# Patient Record
Sex: Male | Born: 1975 | Race: Asian | Hispanic: No | Marital: Single | Smoking: Never smoker
Health system: Southern US, Community
[De-identification: ages and names within clinical notes are randomized; demographics above are authoritative.]

## PROBLEM LIST (undated history)

## (undated) HISTORY — PX: TONSILLECTOMY: SUR1361

---

## 2000-02-10 ENCOUNTER — Other Ambulatory Visit: Admission: RE | Admit: 2000-02-10 | Discharge: 2000-02-10 | Payer: Self-pay | Admitting: Otolaryngology

## 2005-04-07 ENCOUNTER — Observation Stay (HOSPITAL_COMMUNITY): Admission: EM | Admit: 2005-04-07 | Discharge: 2005-04-08 | Payer: Self-pay | Admitting: Emergency Medicine

## 2011-03-07 ENCOUNTER — Other Ambulatory Visit: Payer: Self-pay | Admitting: Podiatry

## 2011-03-07 DIAGNOSIS — M779 Enthesopathy, unspecified: Secondary | ICD-10-CM

## 2011-03-11 ENCOUNTER — Other Ambulatory Visit: Payer: Self-pay | Admitting: Podiatry

## 2011-03-11 ENCOUNTER — Other Ambulatory Visit: Payer: Self-pay

## 2011-03-11 ENCOUNTER — Ambulatory Visit
Admission: RE | Admit: 2011-03-11 | Discharge: 2011-03-11 | Disposition: A | Discharge status: Home / Self Care | Payer: BC Managed Care – PPO | Source: Ambulatory Visit | Attending: Podiatry | Admitting: Podiatry

## 2011-03-11 DIAGNOSIS — IMO0001 Reserved for inherently not codable concepts without codable children: Secondary | ICD-10-CM

## 2011-03-11 DIAGNOSIS — M779 Enthesopathy, unspecified: Secondary | ICD-10-CM

## 2011-03-11 MED ORDER — GADOBENATE DIMEGLUMINE 529 MG/ML IV SOLN
10.0000 mL | Freq: Once | INTRAVENOUS | Status: AC | PRN
  Administered 2011-03-11: 10 mL via INTRAVENOUS

## 2017-04-19 DIAGNOSIS — G4733 Obstructive sleep apnea (adult) (pediatric): Secondary | ICD-10-CM | POA: Diagnosis not present

## 2017-05-19 DIAGNOSIS — G4733 Obstructive sleep apnea (adult) (pediatric): Secondary | ICD-10-CM | POA: Diagnosis not present

## 2017-06-19 DIAGNOSIS — G4733 Obstructive sleep apnea (adult) (pediatric): Secondary | ICD-10-CM | POA: Diagnosis not present

## 2017-06-20 ENCOUNTER — Emergency Department (HOSPITAL_BASED_OUTPATIENT_CLINIC_OR_DEPARTMENT_OTHER)
Admission: EM | Admit: 2017-06-20 | Discharge: 2017-06-20 | Disposition: A | Discharge status: Home / Self Care | Payer: BLUE CROSS/BLUE SHIELD | Attending: Physician Assistant | Admitting: Physician Assistant

## 2017-06-20 ENCOUNTER — Encounter (HOSPITAL_BASED_OUTPATIENT_CLINIC_OR_DEPARTMENT_OTHER): Payer: Self-pay | Admitting: Emergency Medicine

## 2017-06-20 ENCOUNTER — Emergency Department (HOSPITAL_BASED_OUTPATIENT_CLINIC_OR_DEPARTMENT_OTHER): Payer: BLUE CROSS/BLUE SHIELD

## 2017-06-20 DIAGNOSIS — M1712 Unilateral primary osteoarthritis, left knee: Secondary | ICD-10-CM | POA: Diagnosis not present

## 2017-06-20 DIAGNOSIS — M25562 Pain in left knee: Secondary | ICD-10-CM | POA: Insufficient documentation

## 2017-06-20 NOTE — Discharge Instructions (Signed)
Please rest ice and elevate and use ibuprofen to treat your pain.

## 2017-06-20 NOTE — ED Provider Notes (Signed)
[facility]   CSN: [id number] Arrival date & time: 06/20/17  2018     History   Chief Complaint Chief Complaint  Patient presents with  . Knee Pain    HPI VA BROADWELL is a 41 y.o. male.  HPI   Patient is a 41 year old male presenting with left knee pain worse in the morning. Patient has x-ray showing small joint effusion. No erythema no warmth. No signs of infection. Patient reports he uses a lot of work. Worse with movement.  History reviewed. No pertinent past medical history.  There are no active problems to display for this patient.   History reviewed. No pertinent surgical history.     Home Medications    Prior to Admission medications   Not on File    Family History No family history on file.  Social History Social History  Substance Use Topics  . Smoking status: Never Smoker  . Smokeless tobacco: Never Used  . Alcohol use Yes     Comment: occ     Allergies   Patient has no known allergies.   Review of Systems Review of Systems  Constitutional: Negative for activity change.  Respiratory: Negative for shortness of breath.   Cardiovascular: Negative for chest pain.  Gastrointestinal: Negative for abdominal pain.  Musculoskeletal: Positive for joint swelling.     Physical Exam Updated Vital Signs BP 134/88 (BP Location: Left Arm)   Pulse 89   Temp 98.3 F (36.8 C) (Oral)   Resp 18   Ht 5\' 9"  (1.753 m)   Wt 95.3 kg (210 lb)   SpO2 100%   BMI 31.01 kg/m   Physical Exam  Constitutional: He is oriented to person, place, and time. He appears well-nourished.  HENT:  Head: Normocephalic.  Eyes: Conjunctivae are normal.  Cardiovascular: Normal rate.   Pulmonary/Chest: Effort normal.  Musculoskeletal:  Nor erythema no warmth. Mild tenderness with range, no obvious swelling.  Neurological: He is oriented to person, place, and time.  Skin: Skin is warm and dry. He is not diaphoretic.  Psychiatric: He has a  normal mood and affect. His behavior is normal.     ED Treatments / Results  Labs (all labs ordered are listed, but only abnormal results are displayed) Labs Reviewed - No data to display  EKG  EKG Interpretation None       Radiology Dg Knee Complete 4 Views Left  Result Date: 06/20/2017 CLINICAL DATA:  Lateral knee pain x1 week without known injury. EXAM: LEFT KNEE - COMPLETE 4+ VIEW COMPARISON:  None. FINDINGS: Femorotibial and patellofemoral joint space narrowing with spurring is identified consistent with osteoarthritis. Trace suprapatellar joint effusion is noted. No acute fracture, intra-articular loose body nor bone destruction. No joint dislocation. Soft tissues are unremarkable. 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 Electronically Signed   By: Tollie Eth M.D.   On: 06/20/2017 21:09    Procedures Procedures (including critical care time)  Medications Ordered in ED Medications - No data to display   Initial Impression / Assessment and Plan / ED Course  I have reviewed the triage vital signs and the nursing notes.  Pertinent labs & imaging results that were available during my care of the patient were reviewed by me and considered in my medical decision making (see chart for details).     Well-appearing 41 year old presenting with left knee pain. X-ray shows arthritis with effusion. Doubt septic joint no erythema no warmth. We'll have him rest ice  elevate and use knee sleeve.  Final Clinical Impressions(s) / ED Diagnoses   Final diagnoses:  None    New Prescriptions New Prescriptions   No medications on file     Abelino DerrickMackuen, Pearle Wandler Lyn, MD 06/20/17 2237

## 2017-06-20 NOTE — ED Triage Notes (Signed)
aptient states that he has had pain to his left knee x 2 days. He reports it is worse in the morning

## 2017-07-02 DIAGNOSIS — G4733 Obstructive sleep apnea (adult) (pediatric): Secondary | ICD-10-CM | POA: Diagnosis not present

## 2018-01-04 DIAGNOSIS — G4733 Obstructive sleep apnea (adult) (pediatric): Secondary | ICD-10-CM | POA: Diagnosis not present

## 2018-07-07 DIAGNOSIS — G4733 Obstructive sleep apnea (adult) (pediatric): Secondary | ICD-10-CM | POA: Diagnosis not present

## 2019-01-20 DIAGNOSIS — G4733 Obstructive sleep apnea (adult) (pediatric): Secondary | ICD-10-CM | POA: Diagnosis not present

## 2019-03-27 ENCOUNTER — Encounter (HOSPITAL_BASED_OUTPATIENT_CLINIC_OR_DEPARTMENT_OTHER): Payer: Self-pay | Admitting: Emergency Medicine

## 2019-03-27 ENCOUNTER — Other Ambulatory Visit: Payer: Self-pay

## 2019-03-27 ENCOUNTER — Emergency Department (HOSPITAL_BASED_OUTPATIENT_CLINIC_OR_DEPARTMENT_OTHER): Payer: BLUE CROSS/BLUE SHIELD

## 2019-03-27 ENCOUNTER — Emergency Department (HOSPITAL_BASED_OUTPATIENT_CLINIC_OR_DEPARTMENT_OTHER)
Admission: EM | Admit: 2019-03-27 | Discharge: 2019-03-27 | Disposition: A | Discharge status: Home / Self Care | Payer: BLUE CROSS/BLUE SHIELD | Attending: Emergency Medicine | Admitting: Emergency Medicine

## 2019-03-27 DIAGNOSIS — M25571 Pain in right ankle and joints of right foot: Secondary | ICD-10-CM | POA: Insufficient documentation

## 2019-03-27 NOTE — ED Triage Notes (Signed)
R ankle pain since Tuesday. Denies injury.

## 2019-03-27 NOTE — ED Provider Notes (Signed)
[facility] EMERGENCY DEPARTMENT Provider Note   CSN: [id number] Arrival date & time: 03/27/19  1202    History   Chief Complaint Chief Complaint  Patient presents with   Ankle Pain    HPI Brady Vazquez is a 43 y.o. male.     HPI   Brady Vazquez is a 43 y.o. male, patient with no pertinent past medical history, presenting to the ED with right ankle pain for the last 5 days.  States he woke up in the morning with pain to the anterior ankle with mild swelling.  Pain is moderate, described as a tightness and soreness, now involving the lateral and medial malleoli. He is on his feet daily wearing steel toed boots as part of his job, which involves a lot of walking. He has tried ice, elevation, oral anti-inflammatories, and diclofenac gel.  These helped temporarily, but then pain returns. Denies fever/chills, numbness, weakness, falls/trauma, leg pain/swelling, color change, or any other complaints.   History reviewed. No pertinent past medical history.  There are no active problems to display for this patient.   Past Surgical History:  Procedure Laterality Date   TONSILLECTOMY          Home Medications    Prior to Admission medications   Not on File    Family History No family history on file.  Social History Social History   Tobacco Use   Smoking status: Never Smoker   Smokeless tobacco: Never Used  Substance Use Topics   Alcohol use: Yes    Comment: occ   Drug use: No     Allergies   Patient has no known allergies.   Review of Systems Review of Systems  Constitutional: Negative for fever.  Musculoskeletal: Positive for arthralgias and joint swelling.  Neurological: Negative for weakness and numbness.     Physical Exam Updated Vital Signs BP (!) 150/101 (BP Location: Left Arm)    Pulse 73    Temp 98.4 F (36.9 C) (Oral)    Resp 18    Ht 5\' 11"  (1.803 m)    Wt 96.6 kg    SpO2 98%    BMI 29.71 kg/m   Physical Exam Vitals signs  and nursing note reviewed.  Constitutional:      General: He is not in acute distress.    Appearance: He is well-developed. He is not diaphoretic.  HENT:     Head: Normocephalic and atraumatic.  Eyes:     Conjunctiva/sclera: Conjunctivae normal.  Neck:     Musculoskeletal: Neck supple.  Cardiovascular:     Rate and Rhythm: Normal rate and regular rhythm.     Pulses:          Dorsalis pedis pulses are 2+ on the right side and 2+ on the left side.       Posterior tibial pulses are 2+ on the right side and 2+ on the left side.  Pulmonary:     Effort: Pulmonary effort is normal.  Musculoskeletal:     Right ankle: Tenderness. Lateral malleolus and medial malleolus tenderness found.       Feet:     Comments: Tenderness to the anterior right ankle as well as the lateral and medial malleoli.  Mild swelling.  No erythema or increased warmth.  No bruising. No tenderness over the Achilles or into the anterior or posterior lower leg. Dorsiflexion without difficulty or pain.  Plantar flexion with stiffness and pain. Flexion and extension in the toes intact.  Skin:    General: Skin is warm and dry.     Capillary Refill: Capillary refill takes less than 2 seconds.     Coloration: Skin is not pale.  Neurological:     Mental Status: He is alert.     Comments: Sensation to light touch grossly intact in the right foot. Dorsiflexion 5/5, plantar flexion 4/5, patient states this is due to pain. Strength 5/5 with flexion and extension at the right knee. Strength 5/5 with flexion and extension at the right great toe.   Psychiatric:        Behavior: Behavior normal.      ED Treatments / Results  Labs (all labs ordered are listed, but only abnormal results are displayed) Labs Reviewed - No data to display  EKG None  Radiology Dg Ankle Complete Right  Result Date: 03/27/2019 CLINICAL DATA:  Right ankle pain for the past 5 days. No known injury. EXAM: RIGHT ANKLE - COMPLETE 3+ VIEW  COMPARISON:  None. FINDINGS: No fracture or dislocation. Minimal enthesopathic change about the distal tips of the medial and lateral malleoli. Joint spaces appear preserved. The ankle mortise appears preserved given obliquity. No definite ankle joint effusion. Moderate-sized plantar calcaneal spur. Enthesopathic change involving the Achilles tendon insertion site. Regional soft tissues appear normal. IMPRESSION: 1. No acute findings. 2. Enthesopathic change about the medial and lateral malleoli, likely the sequela of remote avulsive injury Electronically Signed   By: Simonne ComeJohn  Watts M.D.   On: 03/27/2019 13:14    Procedures Procedures (including critical care time)  Medications Ordered in ED Medications - No data to display   Initial Impression / Assessment and Plan / ED Course  I have reviewed the triage vital signs and the nursing notes.  Pertinent labs & imaging results that were available during my care of the patient were reviewed by me and considered in my medical decision making (see chart for details).        Patient presents with right ankle pain.  No evidence of neurovascular compromise. Septic joint considered, but thought less likely due to timeline, lack of risk factors, and exam is not suggestive. Weightbearing as tolerated.  Declined crutches. Ankle x-ray appears to have sequela of previous injury.  No acute abnormalities noted. The patient was given instructions for home care as well as return precautions. Patient voices understanding of these instructions, accepts the plan, and is comfortable with discharge.  Discussed his hypertension and need for PCP follow-up.  Asymptomatic at this time.  Final Clinical Impressions(s) / ED Diagnoses   Final diagnoses:  Acute right ankle pain    ED Discharge Orders    None       Concepcion LivingJoy, Jayveon Convey C, PA-C 03/27/19 1337    Virgina Norfolkuratolo, Adam, DO 03/27/19 1349

## 2019-03-27 NOTE — ED Notes (Signed)
ED Provider at bedside. 

## 2019-03-27 NOTE — Discharge Instructions (Addendum)
°  Rest the extremity as often as possible.  Combined this with elevation. Antiinflammatory medications: Take 600 mg of ibuprofen every 6 hours or 440 mg (over the counter dose) to 500 mg (prescription dose) of naproxen every 12 hours for the next 3 days. After this time, these medications may be used as needed for pain. Take these medications with food to avoid upset stomach. Choose only one of these medications, do not take them together. Acetaminophen (generic for Tylenol): Should you continue to have additional pain while taking the ibuprofen or naproxen, you may add in acetaminophen as needed. Your daily total maximum amount of acetaminophen from all sources should be limited to 4000mg /day for persons without liver problems, or 2000mg /day for those with liver problems.  Wear the ankle brace for support and comfort.  Weightbearing as tolerated.  Follow-up with the orthopedic specialist on this matter.  Call to make an appointment.  Your blood pressure was higher than normal today.  Please have this rechecked and evaluated by a primary care provider.

## 2019-03-27 NOTE — ED Notes (Addendum)
Pt c/o right foot pain onset upon awakening on tuesday. Denies injury. Pt denies otc medication pta. Pt states pain increases when extending foot. Pt verbalizes walking daily. States he feels "prickles in the bottom of my foot"

## 2019-03-30 ENCOUNTER — Other Ambulatory Visit: Payer: Self-pay

## 2019-03-30 ENCOUNTER — Ambulatory Visit: Payer: BLUE CROSS/BLUE SHIELD | Admitting: Family Medicine

## 2019-03-30 ENCOUNTER — Encounter: Payer: Self-pay | Admitting: Family Medicine

## 2019-03-30 VITALS — BP 125/80 | HR 77 | Ht 71.0 in | Wt 213.0 lb

## 2019-03-30 DIAGNOSIS — M25571 Pain in right ankle and joints of right foot: Secondary | ICD-10-CM

## 2019-03-30 MED ORDER — DICLOFENAC SODIUM 75 MG PO TBEC: 75.0000 mg | DELAYED_RELEASE_TABLET | Freq: Two times a day (BID) | ORAL | 1 refills | Status: DC

## 2019-03-30 MED ORDER — COLCHICINE 0.6 MG PO TABS: 0.6000 mg | ORAL_TABLET | Freq: Every day | ORAL | 0 refills | Status: AC

## 2019-03-30 NOTE — Progress Notes (Signed)
PCP: Patient, No Pcp Per  Subjective:   HPI: Patient is a 43 y.o. male here for right ankle pain.  Patient reports right ankle pain for 1 week.  He woke up in the morning with significant pain and swelling in the ankle.  He denies any associated injury in the days prior.  Initially he reports having significant pain with weightbearing and walking and unable to move the ankle without pain.  He was seen in the emergency department 03/27/2019.  X-rays were performed and he was discharged with an ankle brace.  Since initial onset he does report some improvement in his pain as well as range of motion.  Today is having 5/10 pain mostly around the anterior medial ankle.  He does feel like it is swollen.  Initially it was warm to the touch but this has improved.  He denies any bruising or erythema.  No distal numbness or tingling.  He denies history of gout.  Since onset he is been resting and using ice.  He is applied Voltaren gel which is somewhat helpful.  No other associated skin injuries or skin changes.  No notable prior ankle injuries that he remembers. Denies fevers or chills.  No history of gout.  No past medical history on file.  No current outpatient medications on file prior to visit.   No current facility-administered medications on file prior to visit.     Past Surgical History:  Procedure Laterality Date  . TONSILLECTOMY      No Known Allergies  Social History   Socioeconomic History  . Marital status: Single    Spouse name: Not on file  . Number of children: Not on file  . Years of education: Not on file  . Highest education level: Not on file  Occupational History  . Not on file  Social Needs  . Financial resource strain: Not on file  . Food insecurity:    Worry: Not on file    Inability: Not on file  . Transportation needs:    Medical: Not on file    Non-medical: Not on file  Tobacco Use  . Smoking status: Never Smoker  . Smokeless tobacco: Never Used  Substance  and Sexual Activity  . Alcohol use: Yes    Comment: occ  . Drug use: No  . Sexual activity: Not on file  Lifestyle  . Physical activity:    Days per week: Not on file    Minutes per session: Not on file  . Stress: Not on file  Relationships  . Social connections:    Talks on phone: Not on file    Gets together: Not on file    Attends religious service: Not on file    Active member of club or organization: Not on file    Attends meetings of clubs or organizations: Not on file    Relationship status: Not on file  . Intimate partner violence:    Fear of current or ex partner: Not on file    Emotionally abused: Not on file    Physically abused: Not on file    Forced sexual activity: Not on file  Other Topics Concern  . Not on file  Social History Narrative  . Not on file    No family history on file.  There were no vitals taken for this visit.  Review of Systems: See HPI above.     Objective:  Physical Exam:  Gen: awake, alert, NAD, comfortable in exam room Pulm: breathing  unlabored  Right ankle: - Inspection: No bony deformity.  No erythema.  There is mild ankle effusion.  No ecchymosis - Palpation: No bony tenderness.  He is tender over the anterior medial tibiotalar joint. -Range of motion: Slightly limited range of motion all planes compared to the left -Strength: 5/5 strength with minimal pain - Neuro/vasc: NV intact - Special Tests: Negative anterior drawer, negative calcaneal squeeze.  Negative syndesmotic compression.  MSK US: mild ankle joint effusion noted  Left ankle: No obvious deformity or swelling Full range of motion 5/5 strength No tenderness to palpation N/V intact distally   Assessment & Plan:  1.  Right ankle pain - highly likely 2/2 to gout. No concern at this time for septic arthritis. Not sufficient enough of an effusion to aspirate - will treat with colchicine and PO diclofenac - call in a week to let us know how he's doing

## 2019-03-30 NOTE — Patient Instructions (Signed)
You have a gout flare of your ankle. Ice the area 15 minutes at a time 3-4 times a day. Take diclofenac 75mg  twice a day with food for pain and inflammation. Don't take aleve or ibuprofen while on the diclofenac. Take colchicine twice tomorrow then once a day. Consider boot when up and walking around. Consider getting your uric acid level checked though I don't think this would change your management as a high percentage of people with gout flares have a normal level in their bloodstream. Call me early next week to let me know how you're doing.

## 2019-03-31 ENCOUNTER — Encounter: Payer: Self-pay | Admitting: Family Medicine

## 2019-03-31 DIAGNOSIS — G4730 Sleep apnea, unspecified: Secondary | ICD-10-CM | POA: Insufficient documentation

## 2019-07-07 DIAGNOSIS — G4733 Obstructive sleep apnea (adult) (pediatric): Secondary | ICD-10-CM | POA: Diagnosis not present

## 2019-10-05 DIAGNOSIS — G4733 Obstructive sleep apnea (adult) (pediatric): Secondary | ICD-10-CM | POA: Diagnosis not present

## 2020-01-03 DIAGNOSIS — G4733 Obstructive sleep apnea (adult) (pediatric): Secondary | ICD-10-CM | POA: Diagnosis not present

## 2020-02-02 ENCOUNTER — Other Ambulatory Visit: Payer: Self-pay | Admitting: Family Medicine

## 2020-04-12 DIAGNOSIS — G4733 Obstructive sleep apnea (adult) (pediatric): Secondary | ICD-10-CM | POA: Diagnosis not present

## 2020-07-20 DIAGNOSIS — G4733 Obstructive sleep apnea (adult) (pediatric): Secondary | ICD-10-CM | POA: Diagnosis not present

## 2020-09-05 ENCOUNTER — Other Ambulatory Visit: Payer: Self-pay

## 2020-09-05 ENCOUNTER — Emergency Department (HOSPITAL_BASED_OUTPATIENT_CLINIC_OR_DEPARTMENT_OTHER)
Admission: EM | Admit: 2020-09-05 | Discharge: 2020-09-05 | Disposition: A | Discharge status: Home / Self Care | Payer: No Typology Code available for payment source | Attending: Emergency Medicine | Admitting: Emergency Medicine

## 2020-09-05 ENCOUNTER — Encounter (HOSPITAL_BASED_OUTPATIENT_CLINIC_OR_DEPARTMENT_OTHER): Payer: Self-pay

## 2020-09-05 DIAGNOSIS — W260XXA Contact with knife, initial encounter: Secondary | ICD-10-CM | POA: Diagnosis not present

## 2020-09-05 DIAGNOSIS — Y990 Civilian activity done for income or pay: Secondary | ICD-10-CM | POA: Diagnosis not present

## 2020-09-05 DIAGNOSIS — Z23 Encounter for immunization: Secondary | ICD-10-CM | POA: Diagnosis not present

## 2020-09-05 DIAGNOSIS — S41112A Laceration without foreign body of left upper arm, initial encounter: Secondary | ICD-10-CM

## 2020-09-05 DIAGNOSIS — S51812A Laceration without foreign body of left forearm, initial encounter: Secondary | ICD-10-CM | POA: Insufficient documentation

## 2020-09-05 MED ORDER — TETANUS-DIPHTH-ACELL PERTUSSIS 5-2.5-18.5 LF-MCG/0.5 IM SUSY
0.5000 mL | PREFILLED_SYRINGE | Freq: Once | INTRAMUSCULAR | Status: AC
  Administered 2020-09-05: 0.5 mL via INTRAMUSCULAR
  Filled 2020-09-05: qty 0.5

## 2020-09-05 MED ORDER — IBUPROFEN 800 MG PO TABS
800.0000 mg | ORAL_TABLET | Freq: Once | ORAL | Status: AC
  Administered 2020-09-05: 800 mg via ORAL
  Filled 2020-09-05: qty 1

## 2020-09-05 MED ORDER — LIDOCAINE-EPINEPHRINE 1 %-1:100000 IJ SOLN
10.0000 mL | Freq: Once | INTRAMUSCULAR | Status: AC
  Administered 2020-09-05: 10 mL via INTRADERMAL
  Filled 2020-09-05: qty 1

## 2020-09-05 NOTE — ED Triage Notes (Signed)
Pt states he cut left FA on box cutter at work ~130pm-NAD-steady gait

## 2020-09-05 NOTE — ED Provider Notes (Signed)
[facility] EMERGENCY DEPARTMENT Provider Note   CSN: [id number] Arrival date & time: 09/05/20  1413     History Chief Complaint  Patient presents with  . Extremity Laceration    Brady Vazquez is a 44 y.o. male who presents with laceration to his left forearm. He states that he was using a box knife at work when he slipped and cut his forearm. He states that the wound has been oozing blood, however it was never spurting blood.  He denies numbness or tingling in his hand. Endorses soreness with motion of his fingers.  Denies weakness in the hand.   He states that he has not had a tetanus shot for quite some time, thinks 10 years.  I personally reviewed this patient's medical records. He has otherwise a fairly healthy man. He is not on any medications every day.  HPI     History reviewed. No pertinent past medical history.  Patient Active Problem List   Diagnosis Date Noted  . Sleep apnea 03/31/2019    Past Surgical History:  Procedure Laterality Date  . TONSILLECTOMY         No family history on file.  Social History   Tobacco Use  . Smoking status: Never Smoker  . Smokeless tobacco: Never Used  Vaping Use  . Vaping Use: Never used  Substance Use Topics  . Alcohol use: Yes    Comment: occ  . Drug use: No    Home Medications Prior to Admission medications   Medication Sig Start Date End Date Taking? Authorizing Provider  colchicine 0.6 MG tablet Take 1 tablet (0.6 mg total) by mouth daily. 03/30/19   Hudnall, Azucena Fallen, MD  diclofenac (VOLTAREN) 75 MG EC tablet TAKE 1 TABLET(75 MG) BY MOUTH TWICE DAILY 02/06/20   Hudnall, Azucena Fallen, MD    Allergies    Patient has no known allergies.  Review of Systems   Review of Systems  Respiratory: Negative.   Cardiovascular: Negative.   Musculoskeletal: Negative.   Skin: Positive for wound.       Left forearm  Allergic/Immunologic: Negative.   Hematological: Negative.     Physical Exam Updated Vital  Signs BP (!) 137/98 (BP Location: Left Arm)   Pulse 94   Temp 98.5 F (36.9 C) (Oral)   Resp 18   Ht 5\' 9"  (1.753 m)   Wt 99.8 kg   SpO2 100%   BMI 32.49 kg/m   Physical Exam Vitals and nursing note reviewed.  HENT:     Head: Normocephalic and atraumatic.  Eyes:     General: No scleral icterus.       Right eye: No discharge.        Left eye: No discharge.     Conjunctiva/sclera: Conjunctivae normal.  Pulmonary:     Effort: Pulmonary effort is normal.  Musculoskeletal:       Arms:     Comments: Left hand is neurovascularly intact.  Patient is able to extend and flex all 5 digits of the left hand with full range of motion.  5 out of 5 grip strength in the left hand.  2+ radial pulses.  Skin:    General: Skin is warm and dry.     Capillary Refill: Capillary refill takes less than 2 seconds.  Neurological:     General: No focal deficit present.     Mental Status: He is alert.  Psychiatric:        Mood and Affect: Mood normal.  ED Results / Procedures / Treatments   Labs (all labs ordered are listed, but only abnormal results are displayed) Labs Reviewed - No data to display  EKG None  Radiology No results found.  Procedures .Marland KitchenLaceration Repair  Date/Time: 09/05/2020 3:42 PM Performed by: Paris Lore, PA-C Authorized by: Paris Lore, PA-C   Consent:    Consent obtained:  Verbal   Consent given by:  Patient   Risks discussed:  Infection, need for additional repair, pain, poor cosmetic result and poor wound healing   Alternatives discussed:  No treatment and delayed treatment Universal protocol:    Procedure explained and questions answered to patient or proxy's satisfaction: yes     Relevant documents present and verified: yes     Test results available and properly labeled: yes     Imaging studies available: yes     Required blood products, implants, devices, and special equipment available: yes     Site/side marked: yes      Immediately prior to procedure, a time out was called: yes     Patient identity confirmed:  Verbally with patient Anesthesia (see MAR for exact dosages):    Anesthesia method:  Local infiltration   Local anesthetic:  Lidocaine 1% WITH epi Laceration details:    Location:  Shoulder/arm   Shoulder/arm location:  L lower arm   Length (cm):  4   Laceration depth: mild laceration of muscle belly, no tendon involvement. Repair type:    Repair type:  Simple Exploration:    Hemostasis achieved with:  Epinephrine   Wound exploration: wound explored through full range of motion and entire depth of wound probed and visualized     Wound extent: fascia violated and muscle damage     Wound extent: no foreign bodies/material noted, no nerve damage noted, no tendon damage noted, no underlying fracture noted and no vascular damage noted     Contaminated: no   Treatment:    Area cleansed with:  Betadine   Amount of cleaning:  Standard   Irrigation solution:  Sterile saline and sterile water   Irrigation volume:  500   Irrigation method:  Pressure wash   Visualized foreign bodies/material removed: no   Skin repair:    Repair method:  Sutures   Suture size:  4-0   Suture material:  Prolene   Suture technique:  Simple interrupted   Number of sutures:  8 Approximation:    Approximation:  Close Post-procedure details:    Dressing:  Antibiotic ointment and non-adherent dressing   Patient tolerance of procedure:  Tolerated well, no immediate complications   (including critical care time)  Medications Ordered in ED Medications - No data to display  ED Course  I have reviewed the triage vital signs and the nursing notes.  Pertinent labs & imaging results that were available during my care of the patient were reviewed by me and considered in my medical decision making (see chart for details).    MDM Rules/Calculators/A&P                         44 year old male who presents with 4 cm  laceration to the left forearm, sustained while using a box cutter at work.   Vital signs were normal on intake, patient mildly hypertensive 137/98.  Tetanus updated today.  Laceration repaired with suture, patient tolerated well. See procedure note above.   Patient without PCP, he should present to urgent care or emergency  department for suture removal in 7 to 10 days.  Infection precautions were given, strict return precautions provided.  Patient stable for discharge at this time.  Final Clinical Impression(s) / ED Diagnoses Final diagnoses:  None    Rx / DC Orders ED Discharge Orders    None       Sherrilee Gilles 09/05/20 1555    Long, Arlyss Repress, MD 09/06/20 [phone]

## 2020-09-05 NOTE — Discharge Instructions (Addendum)
You were seen in the emergency department today for the laceration on your left forearm.  You tolerated the repair very well.   Please return to the emergency department or to an urgent care 7 to 10 days for removal of your sutures.  Please return sooner if you develop any new redness, warmth, or thick milky discharge with a foul odor from the wound, as these would be indications of infection.  Please keep wound clean and dry for the next 24 hours.  You may wash the area as normal in the shower, Please do not submerge the wound.  Please keep the area covered and clean.  You may utilize topical antibiotic ointment.  Please also return to the emergency department sooner if you develop any numbness or tingling or weakness in your hand, or if you develop any of the signs of infection listed above.

## 2020-11-01 DIAGNOSIS — G4733 Obstructive sleep apnea (adult) (pediatric): Secondary | ICD-10-CM | POA: Diagnosis not present

## 2020-11-01 IMAGING — DX RIGHT ANKLE - COMPLETE 3+ VIEW
3 series · 3 of 3 positions shown · non-contrast
Comparison: None.

CLINICAL DATA: Right ankle pain for the past 5 days. No known
injury.

EXAM:
RIGHT ANKLE - COMPLETE 3+ VIEW

[ankle ap]
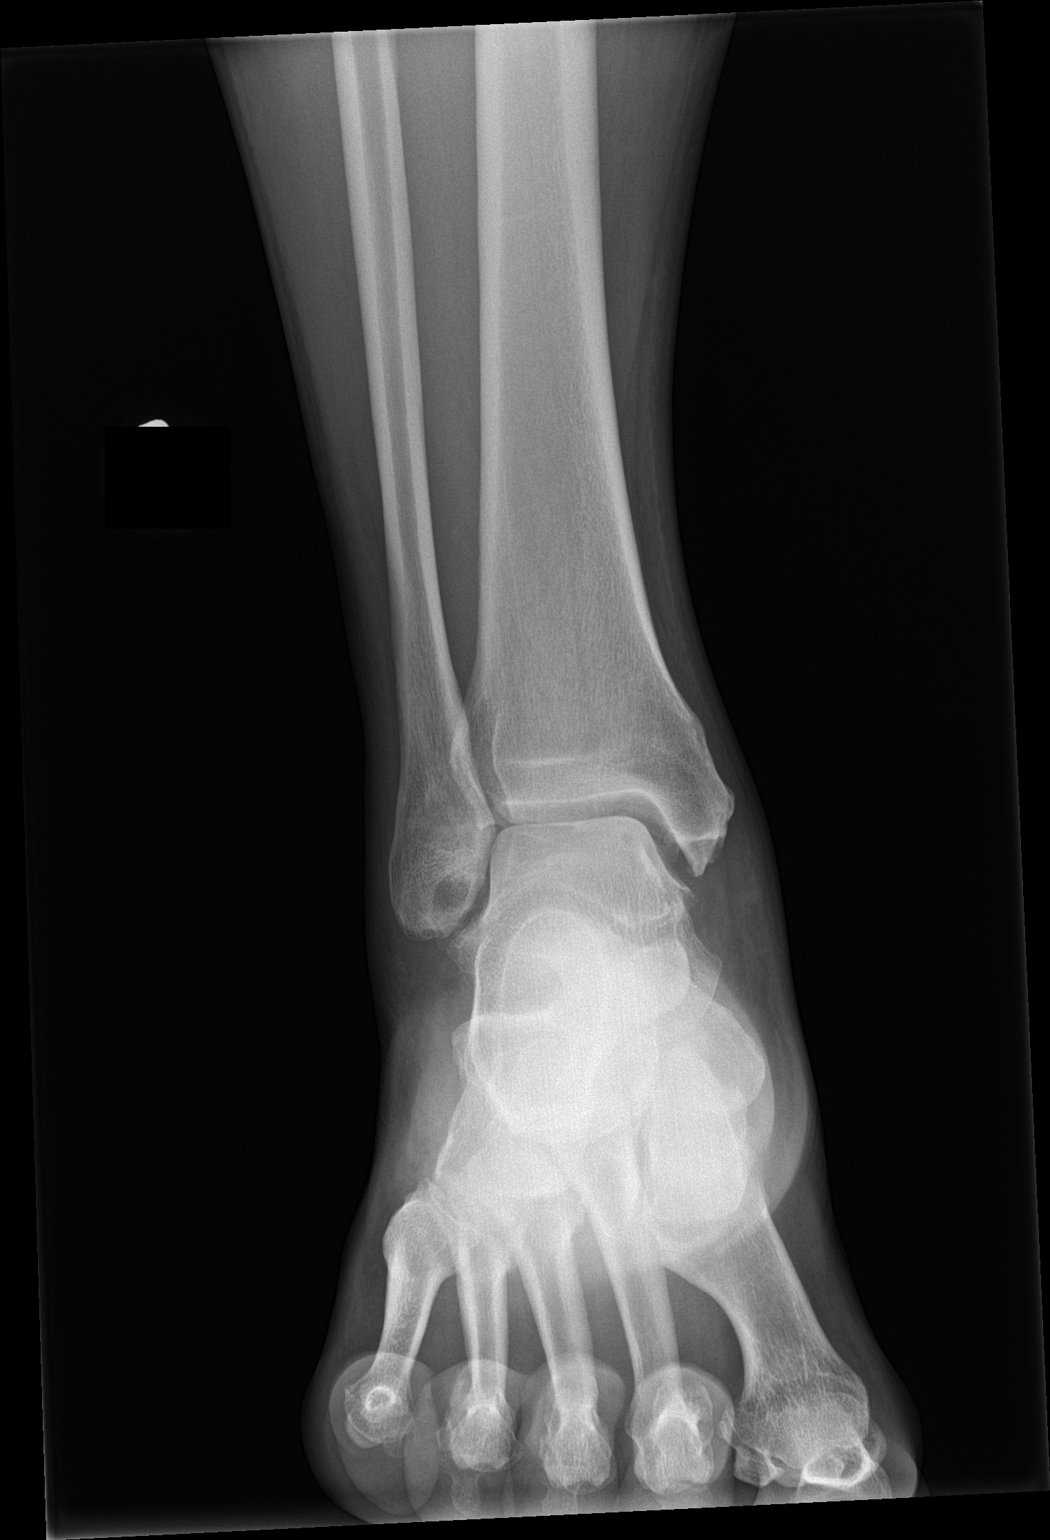

[ankle obl]
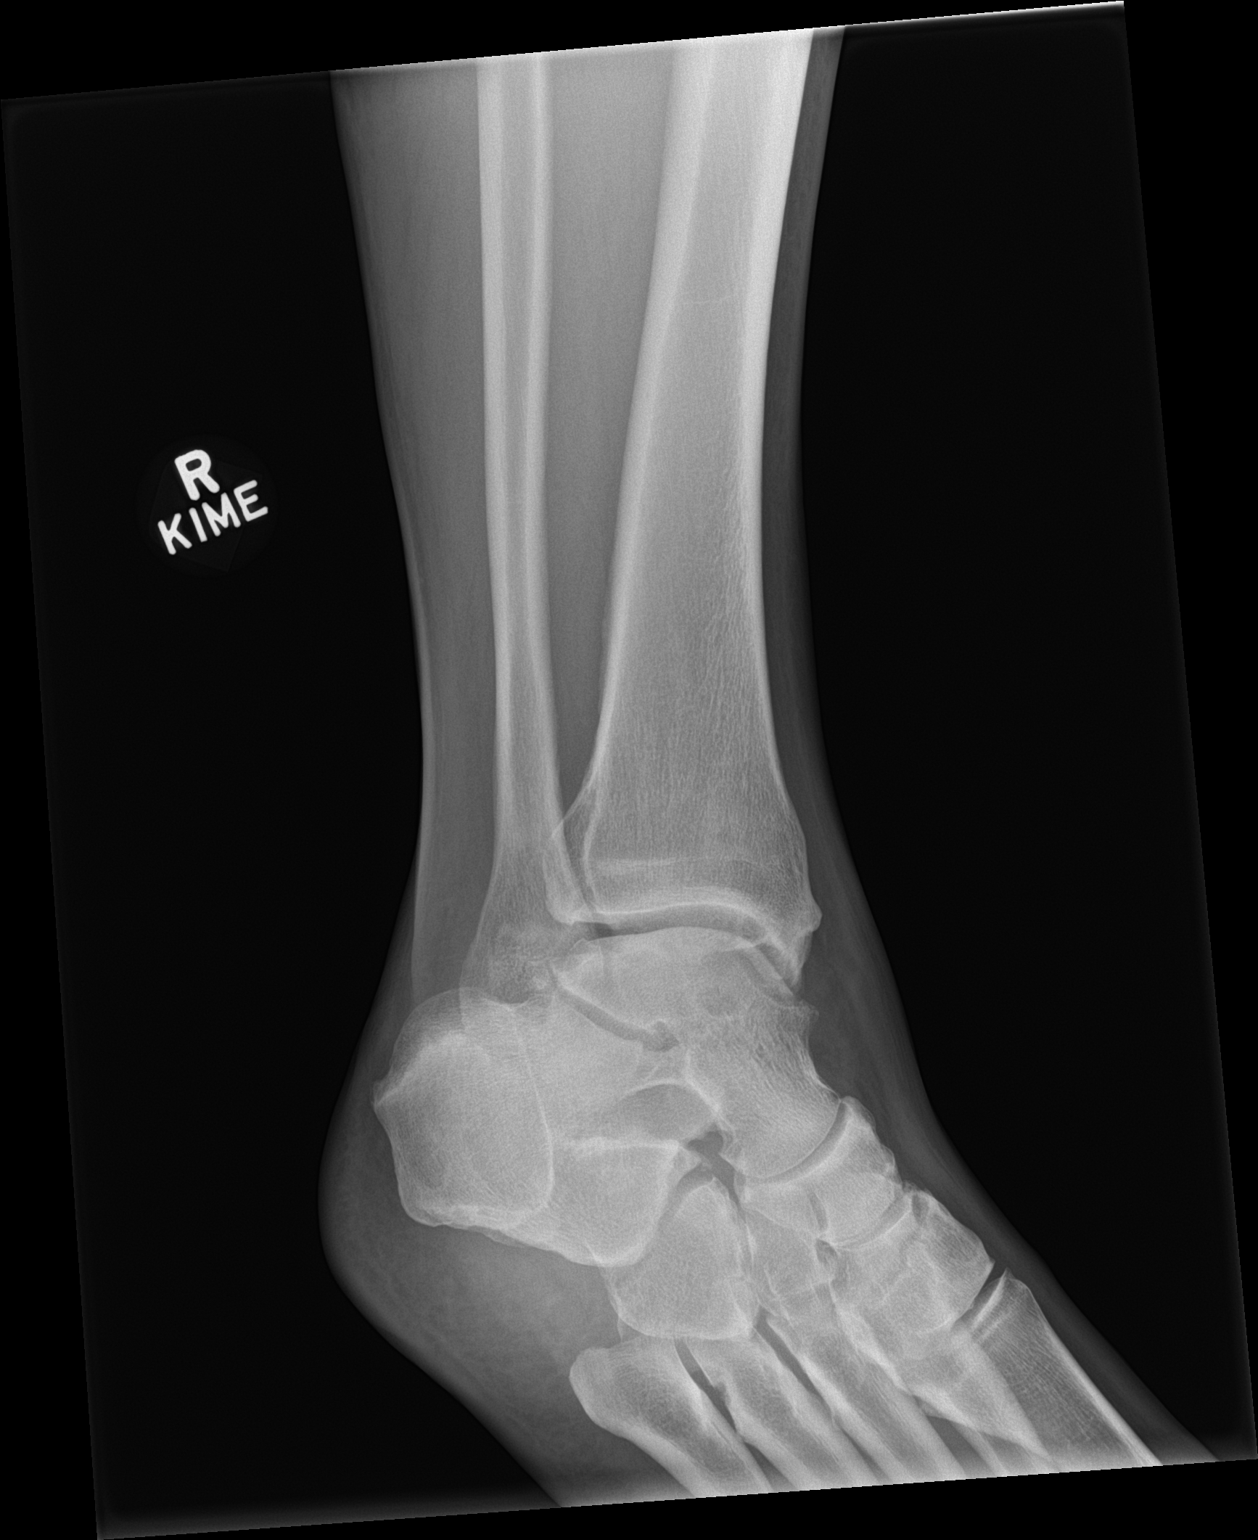

[ankle lat]
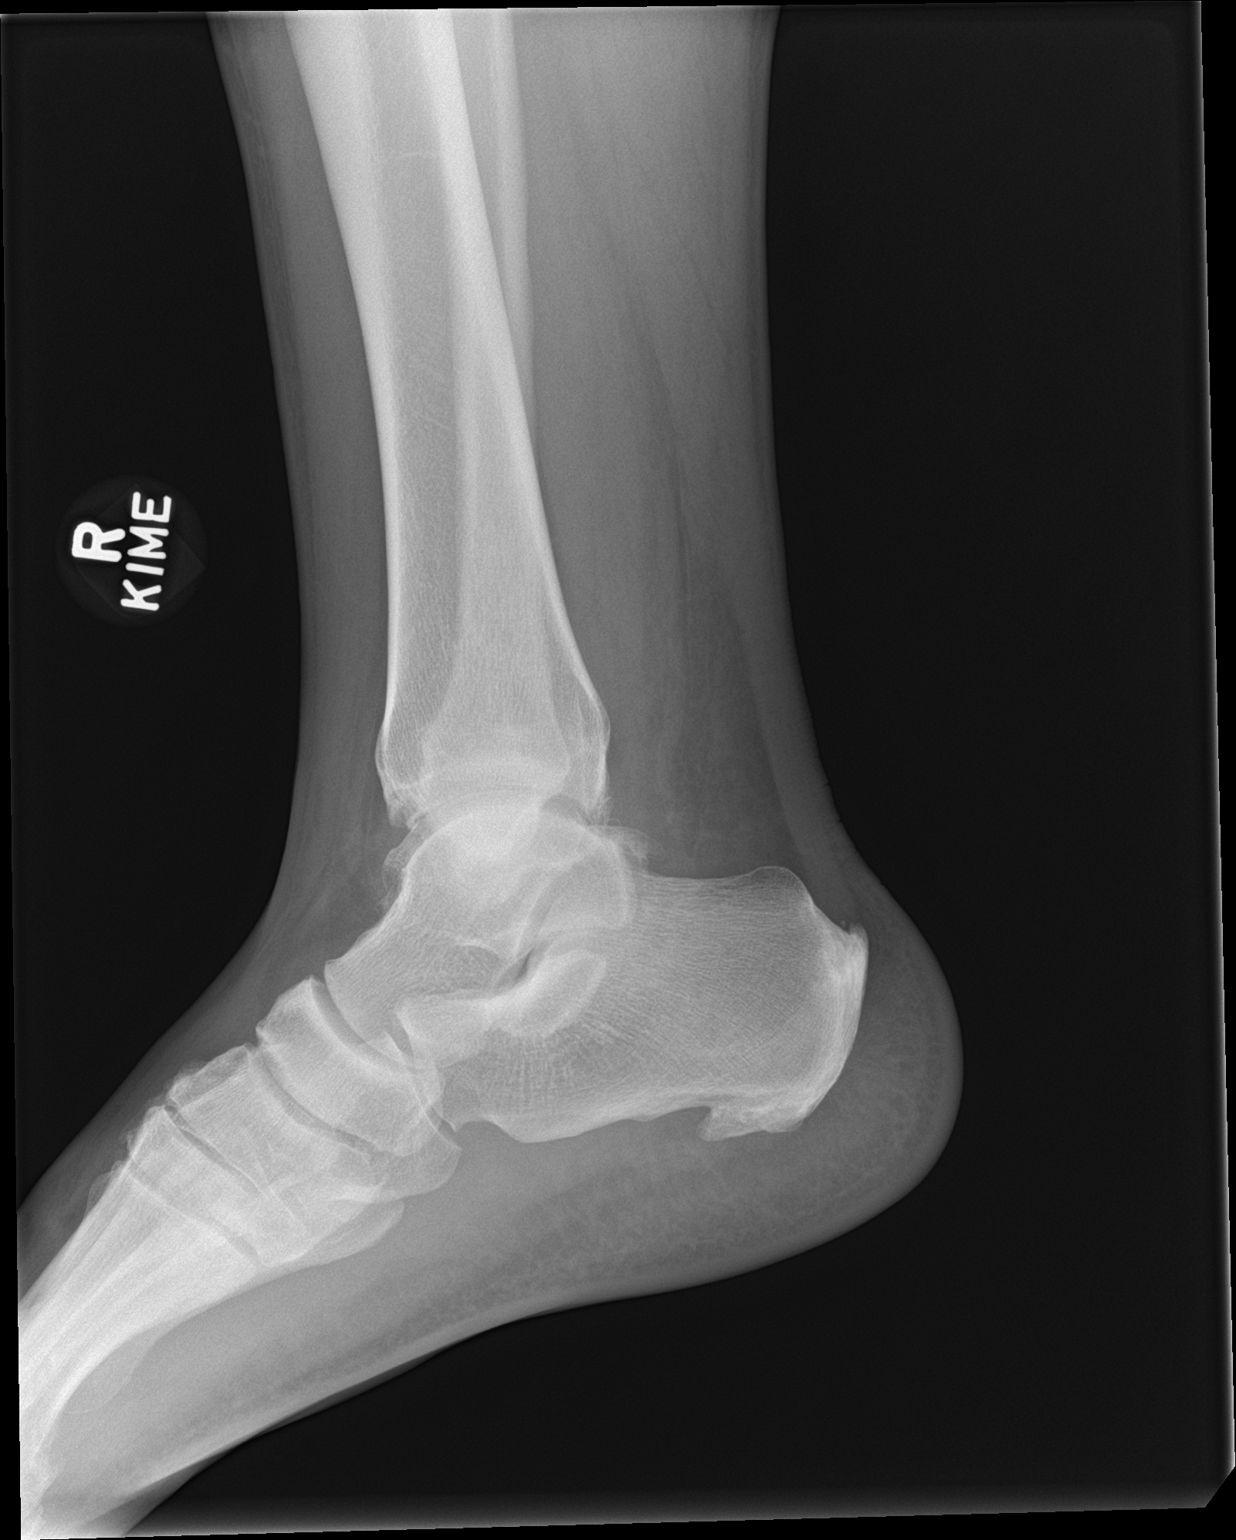

[3 of 3 positions shown; findings below may reference images not displayed]

FINDINGS: No fracture or dislocation. Minimal enthesopathic change about the
distal tips of the medial and lateral malleoli. Joint spaces appear
preserved. The ankle mortise appears preserved given obliquity. No
definite ankle joint effusion. Moderate-sized plantar calcaneal
spur. Enthesopathic change involving the Achilles tendon insertion
site. Regional soft tissues appear normal.
IMPRESSION: 1. No acute findings.
2. Enthesopathic change about the medial and lateral malleoli,
likely the sequela of remote avulsive injury

## 2021-01-30 DIAGNOSIS — G4733 Obstructive sleep apnea (adult) (pediatric): Secondary | ICD-10-CM | POA: Diagnosis not present

## 2021-05-06 DIAGNOSIS — G4733 Obstructive sleep apnea (adult) (pediatric): Secondary | ICD-10-CM | POA: Diagnosis not present

## 2021-08-15 DIAGNOSIS — G4733 Obstructive sleep apnea (adult) (pediatric): Secondary | ICD-10-CM | POA: Diagnosis not present

## 2021-09-15 DIAGNOSIS — G4733 Obstructive sleep apnea (adult) (pediatric): Secondary | ICD-10-CM | POA: Diagnosis not present

## 2021-10-15 DIAGNOSIS — G4733 Obstructive sleep apnea (adult) (pediatric): Secondary | ICD-10-CM | POA: Diagnosis not present
